# Patient Record
Sex: Female | Born: 1975 | Hispanic: Yes | Marital: Married | Smoking: Current every day smoker
Health system: Southern US, Community
[De-identification: ages and names within clinical notes are randomized; demographics above are authoritative.]

## PROBLEM LIST (undated history)

## (undated) DIAGNOSIS — C801 Malignant (primary) neoplasm, unspecified: Secondary | ICD-10-CM

## (undated) DIAGNOSIS — E119 Type 2 diabetes mellitus without complications: Secondary | ICD-10-CM

## (undated) HISTORY — PX: CHOLECYSTECTOMY: SHX55

## (undated) HISTORY — PX: ABDOMINAL SURGERY: SHX537

## (undated) HISTORY — PX: TUBAL LIGATION: SHX77

---

## 2019-07-26 ENCOUNTER — Encounter (HOSPITAL_COMMUNITY): Payer: Self-pay | Admitting: Emergency Medicine

## 2019-07-26 ENCOUNTER — Emergency Department (HOSPITAL_COMMUNITY)
Admission: EM | Admit: 2019-07-26 | Discharge: 2019-07-26 | Disposition: A | Discharge status: Home / Self Care | Payer: Medicaid - Out of State | Attending: Emergency Medicine | Admitting: Emergency Medicine

## 2019-07-26 ENCOUNTER — Other Ambulatory Visit: Payer: Self-pay

## 2019-07-26 ENCOUNTER — Emergency Department (HOSPITAL_COMMUNITY): Payer: Medicaid - Out of State

## 2019-07-26 DIAGNOSIS — E119 Type 2 diabetes mellitus without complications: Secondary | ICD-10-CM | POA: Insufficient documentation

## 2019-07-26 DIAGNOSIS — M545 Low back pain: Secondary | ICD-10-CM | POA: Diagnosis present

## 2019-07-26 DIAGNOSIS — W19XXXA Unspecified fall, initial encounter: Secondary | ICD-10-CM

## 2019-07-26 DIAGNOSIS — F17210 Nicotine dependence, cigarettes, uncomplicated: Secondary | ICD-10-CM | POA: Insufficient documentation

## 2019-07-26 DIAGNOSIS — W010XXA Fall on same level from slipping, tripping and stumbling without subsequent striking against object, initial encounter: Secondary | ICD-10-CM | POA: Insufficient documentation

## 2019-07-26 DIAGNOSIS — Z8543 Personal history of malignant neoplasm of ovary: Secondary | ICD-10-CM | POA: Diagnosis not present

## 2019-07-26 DIAGNOSIS — M5441 Lumbago with sciatica, right side: Secondary | ICD-10-CM

## 2019-07-26 HISTORY — DX: Type 2 diabetes mellitus without complications: E11.9

## 2019-07-26 HISTORY — DX: Malignant (primary) neoplasm, unspecified: C80.1

## 2019-07-26 LAB — PREGNANCY, URINE: Preg Test, Ur: NEGATIVE

## 2019-07-26 MED ORDER — TRAMADOL HCL 50 MG PO TABS
50.0000 mg | ORAL_TABLET | Freq: Once | ORAL | Status: AC
  Administered 2019-07-26: 50 mg via ORAL
  Filled 2019-07-26: qty 1

## 2019-07-26 MED ORDER — KETOROLAC TROMETHAMINE 60 MG/2ML IM SOLN
60.0000 mg | Freq: Once | INTRAMUSCULAR | Status: AC
  Administered 2019-07-26: 60 mg via INTRAMUSCULAR
  Filled 2019-07-26: qty 2

## 2019-07-26 MED ORDER — CYCLOBENZAPRINE HCL 10 MG PO TABS
10.0000 mg | ORAL_TABLET | Freq: Once | ORAL | Status: AC
  Administered 2019-07-26: 10 mg via ORAL
  Filled 2019-07-26: qty 1

## 2019-07-26 MED ORDER — CYCLOBENZAPRINE HCL 10 MG PO TABS: 10.0000 mg | ORAL_TABLET | Freq: Two times a day (BID) | ORAL | 0 refills | Status: AC | PRN

## 2019-07-26 NOTE — ED Provider Notes (Signed)
[facility] Provider Note   CSN: [id number] Arrival date & time: 07/26/19  1303     History   Chief Complaint Chief Complaint  Patient presents with  . Fall    HPI Sabrina Armstrong is a 43 y.o. female.     Patient is a 43 year old female with past medical history of ovarian cancer, diabetes, chronic back pain who presents emergency department for back pain.  Patient reports that she oftentimes has her right leg give out secondary to her spinal stenosis in her back.  Reports that today she was trying to walk very quickly to get away from some dogs that were outside when her right leg gave out.  She reports that she fell directly onto both of her knees.  She has been able to ambulate since then but reports increased pain in her back radiating into her right leg.  She denies any numbness, tingling, weakness in the leg, saddle anesthesia, fever.  Denies any abrasions or open skin wounds.  Did not hit her head or pass out.  Reports that she feels like she worsened her back condition.  She reports that she is from [place] and left her medication for her pain at home.     Past Medical History:  Diagnosis Date  . Cancer ([place])    ovarian cancer  . Diabetes mellitus without complication ([place])     There are no active problems to display for this patient.      OB History   No obstetric history on file.      Home Medications    Prior to Admission medications   Not on File    Family History No family history on file.  Social History Social History   Tobacco Use  . Smoking status: Current Every Day Smoker    Packs/day: 0.50    Types: Cigarettes  . Smokeless tobacco: Never Used  Substance Use Topics  . Alcohol use: Not Currently  . Drug use: Not Currently     Allergies   Hydrocodone and Aspirin   Review of Systems Review of Systems  Constitutional: Negative for chills and fever.  Gastrointestinal: Negative for nausea and vomiting.   Musculoskeletal: Positive for arthralgias, back pain and gait problem. Negative for joint swelling, myalgias, neck pain and neck stiffness.  Skin: Negative for rash and wound.  Neurological: Negative for dizziness, syncope, light-headedness and numbness.  All other systems reviewed and are negative.    Physical Exam Updated Vital Signs BP 98/77 (BP Location: Right Arm)   Pulse 70   Temp 98.2 F (36.8 C)   Resp 18   Ht 5\' 5"  (1.651 m)   Wt 68 kg   LMP 06/25/2019 (Approximate)   SpO2 99%   BMI 24.96 kg/m   Physical Exam Vitals signs and nursing note reviewed. Exam conducted with a chaperone present.  Constitutional:      Appearance: Normal appearance.  HENT:     Head: Normocephalic.  Eyes:     Conjunctiva/sclera: Conjunctivae normal.  Pulmonary:     Effort: Pulmonary effort is normal.  Musculoskeletal:        General: Tenderness present. No swelling, deformity or signs of injury.     Lumbar back: She exhibits tenderness and pain. She exhibits normal range of motion, no bony tenderness, no swelling, no edema, no laceration, no spasm and normal pulse.       Back:     Right lower leg: No edema.     Left lower  leg: No edema.  Skin:    General: Skin is warm and dry.  Neurological:     General: No focal deficit present.     Mental Status: She is alert.     Sensory: No sensory deficit.     Motor: No weakness.     Coordination: Coordination normal.     Gait: Gait normal.     Deep Tendon Reflexes: Reflexes normal.  Psychiatric:        Mood and Affect: Mood normal.      ED Treatments / Results  Labs (all labs ordered are listed, but only abnormal results are displayed) Labs Reviewed - No data to display  EKG None  Radiology No results found.  Procedures Procedures (including critical care time)  Medications Ordered in ED Medications  ketorolac (TORADOL) injection 60 mg (has no administration in time range)  cyclobenzaprine (FLEXERIL) tablet 10 mg (has no  administration in time range)     Initial Impression / Assessment and Plan / ED Course  I have reviewed the triage vital signs and the nursing notes.  Pertinent labs & imaging results that were available during my care of the patient were reviewed by me and considered in my medical decision making (see chart for details).  Clinical Course as of Jul 25 2112  Sat Jul 26, 2019  1627 Patient here with acute on chronic back pain after a fall outside today. She has hx of back pain. I reviewed her PMP for [place] where she is from and she has had multiple prescriptions from multiple providers in the last month. She states anaphylaxis from hydrocodone. It appears she most recently filled ultram. I am going to give her one dose of ultram. She also got flexeril and tramadol. Xray negative. D/c after meds   [KM]    Clinical Course User Index [KM] Alveria Apley, PA-C         Final Clinical Impressions(s) / ED Diagnoses   Final diagnoses:  None    ED Discharge Orders    None       Kristine Royal 07/26/19 2114    Francine Graven, DO 07/29/19 1103

## 2019-07-26 NOTE — Discharge Instructions (Signed)
Thank you for allowing me to care for you today. Please return to the emergency department if you have new or worsening symptoms. Take your medications as instructed.  ° °

## 2019-07-26 NOTE — ED Provider Notes (Signed)
[facility] Provider Note   CSN: [id number] Arrival date & time: 07/26/19  1303     History   Chief Complaint Chief Complaint  Patient presents with  . Fall    HPI Sabrina Armstrong is a 43 y.o. female.     HPI  Past Medical History:  Diagnosis Date  . Cancer ([place])    ovarian cancer  . Diabetes mellitus without complication ([place])     There are no active problems to display for this patient.      OB History   No obstetric history on file.      Home Medications    Prior to Admission medications   Medication Sig Start Date End Date Taking? Authorizing Provider  acetaminophen (TYLENOL 8 HOUR ARTHRITIS PAIN) 650 MG CR tablet Take 1,300 mg by mouth every 8 (eight) hours as needed for pain.   Yes [provider]  acetaminophen (TYLENOL) 500 MG tablet Take 1,000 mg by mouth every 6 (six) hours as needed for mild pain, moderate pain or headache.   Yes [provider]  Buprenorphine HCl 900 MCG FILM Place 900 mcg inside cheek 2 (two) times daily.   Yes [provider]  cyclobenzaprine (FLEXERIL) 10 MG tablet Take 1 tablet (10 mg total) by mouth 2 (two) times daily as needed for up to 7 days for muscle spasms. 07/26/19 08/02/19  Alveria Apley, PA-C    Family History No family history on file.  Social History Social History   Tobacco Use  . Smoking status: Current Every Day Smoker    Packs/day: 0.50    Types: Cigarettes  . Smokeless tobacco: Never Used  Substance Use Topics  . Alcohol use: Not Currently  . Drug use: Not Currently     Allergies   Hydrocodone and Aspirin   Review of Systems Review of Systems   Physical Exam Updated Vital Signs BP 98/77 (BP Location: Right Arm)   Pulse 70   Temp 98.2 F (36.8 C)   Resp 18   Ht 5\' 5"  (1.651 m)   Wt 68 kg   LMP 06/25/2019 (Approximate)   SpO2 99%   BMI 24.96 kg/m   Physical Exam   ED Treatments / Results  Labs (all labs ordered are listed, but  only abnormal results are displayed) Labs Reviewed  PREGNANCY, URINE    EKG None  Radiology Dg Lumbar Spine 2-3 Views  Result Date: 07/26/2019 CLINICAL DATA:  Fall EXAM: LUMBAR SPINE - 2-3 VIEW COMPARISON:  None. FINDINGS: Clips in the upper abdomen. Five non rib-bearing lumbar type vertebra. Lumbar alignment is within normal limits. The vertebral body heights are maintained. Disc spaces are within normal limits. IMPRESSION: Negative. Electronically Signed   By: Donavan Foil M.D.   On: 07/26/2019 16:03    Procedures Procedures (including critical care time)  Medications Ordered in ED Medications  ketorolac (TORADOL) injection 60 mg (60 mg Intramuscular Given 07/26/19 1457)  cyclobenzaprine (FLEXERIL) tablet 10 mg (10 mg Oral Given 07/26/19 1458)  traMADol (ULTRAM) tablet 50 mg (50 mg Oral Given 07/26/19 1639)     Initial Impression / Assessment and Plan / ED Course  I have reviewed the triage vital signs and the nursing notes.  Pertinent labs & imaging results that were available during my care of the patient were reviewed by me and considered in my medical decision making (see chart for details).  Clinical Course as of Jul 26 1651  Sat Jul 26, 2019  1627 Patient  here with acute on chronic back pain after a fall outside today. She has hx of back pain. I reviewed her PMP for [place] where she is from and she has had multiple prescriptions from multiple providers in the last month. She states anaphylaxis from hydrocodone. It appears she most recently filled ultram. I am going to give her one dose of ultram. She also got flexeril and tramadol. Xray negative. D/c after meds   [KM]    Clinical Course User Index [KM] Alveria Apley, PA-C       Based on review of vitals, medical screening exam, lab work and/or imaging, there does not appear to be an acute, emergent etiology for the patient's symptoms. Counseled pt on good return precautions and encouraged both PCP and ED follow-up  as needed.  Prior to discharge, I also discussed incidental imaging findings with patient in detail and advised appropriate, recommended follow-up in detail.  Clinical Impression: 1. Fall, initial encounter   2. Acute right-sided low back pain with right-sided sciatica     Disposition: Discharge  Prior to providing a prescription for a controlled substance, I independently reviewed the patient's recent prescription history on the [facility]. The patient had no recent or regular prescriptions and was deemed appropriate for a brief, less than 3 day prescription of narcotic for acute analgesia.  This note was prepared with assistance of Systems analyst. Occasional wrong-word or sound-a-like substitutions may have occurred due to the inherent limitations of voice recognition software.   Final Clinical Impressions(s) / ED Diagnoses   Final diagnoses:  Fall, initial encounter  Acute right-sided low back pain with right-sided sciatica    ED Discharge Orders         Ordered    cyclobenzaprine (FLEXERIL) 10 MG tablet  2 times daily PRN     07/26/19 1651           Kristine Royal 07/26/19 1652    Fredia Sorrow, MD 08/09/19 [phone]

## 2019-07-26 NOTE — ED Notes (Signed)
Patient transported to X-ray 
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
## 2021-03-17 IMAGING — DX DG LUMBAR SPINE 2-3V
3 series · 3 of 3 positions shown · non-contrast
Comparison: None.

CLINICAL DATA: Fall

EXAM:
LUMBAR SPINE - 2-3 VIEW

[l-spine ap]
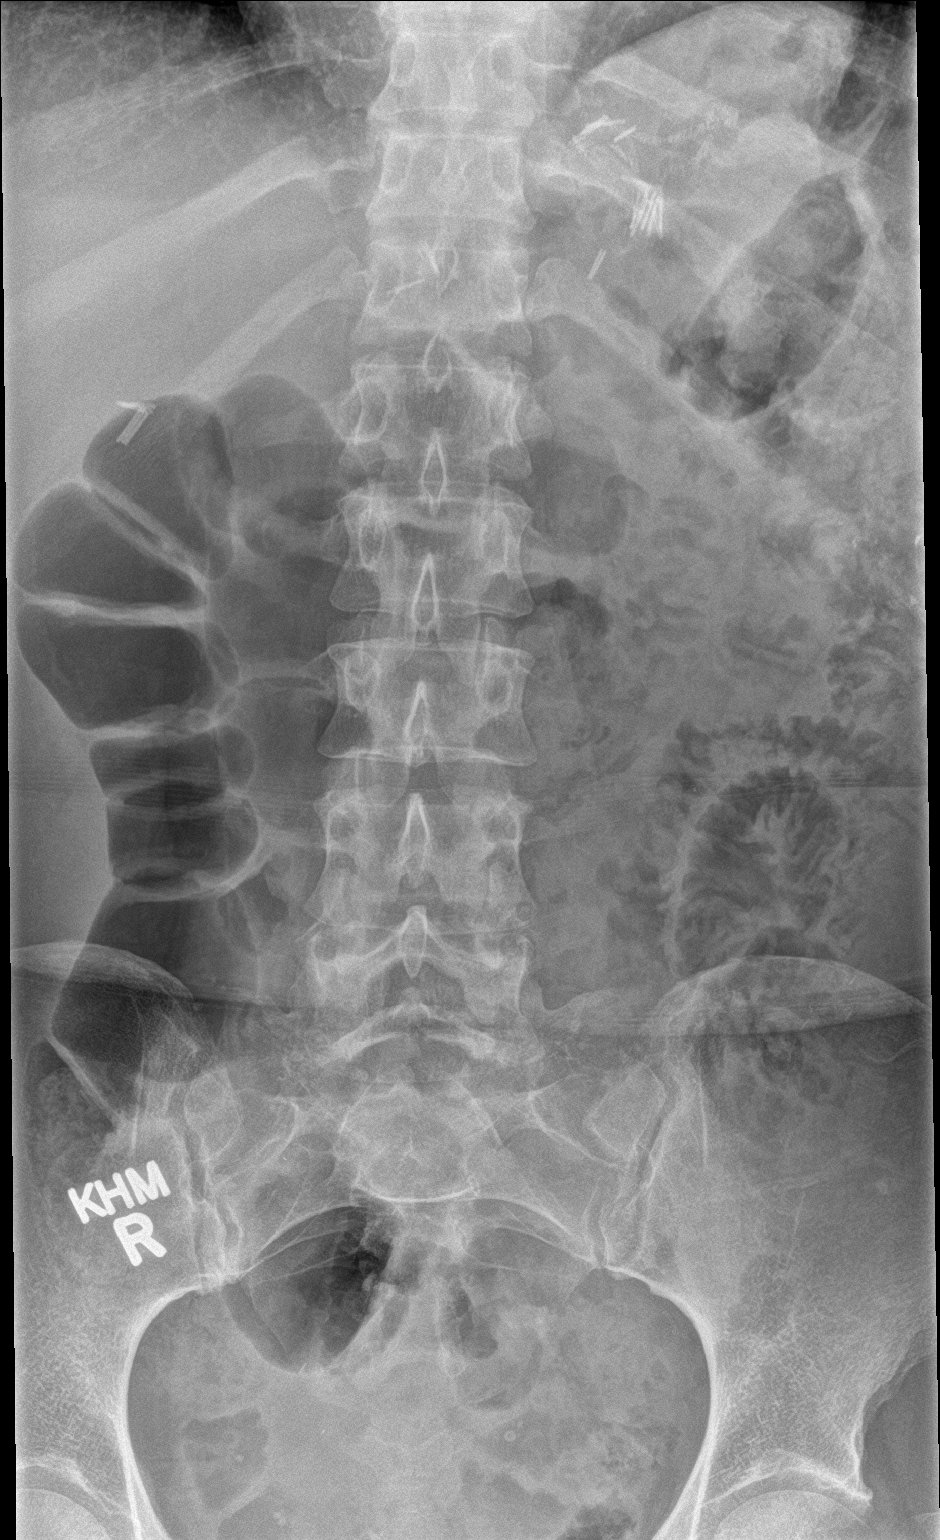

[l-spine lat]
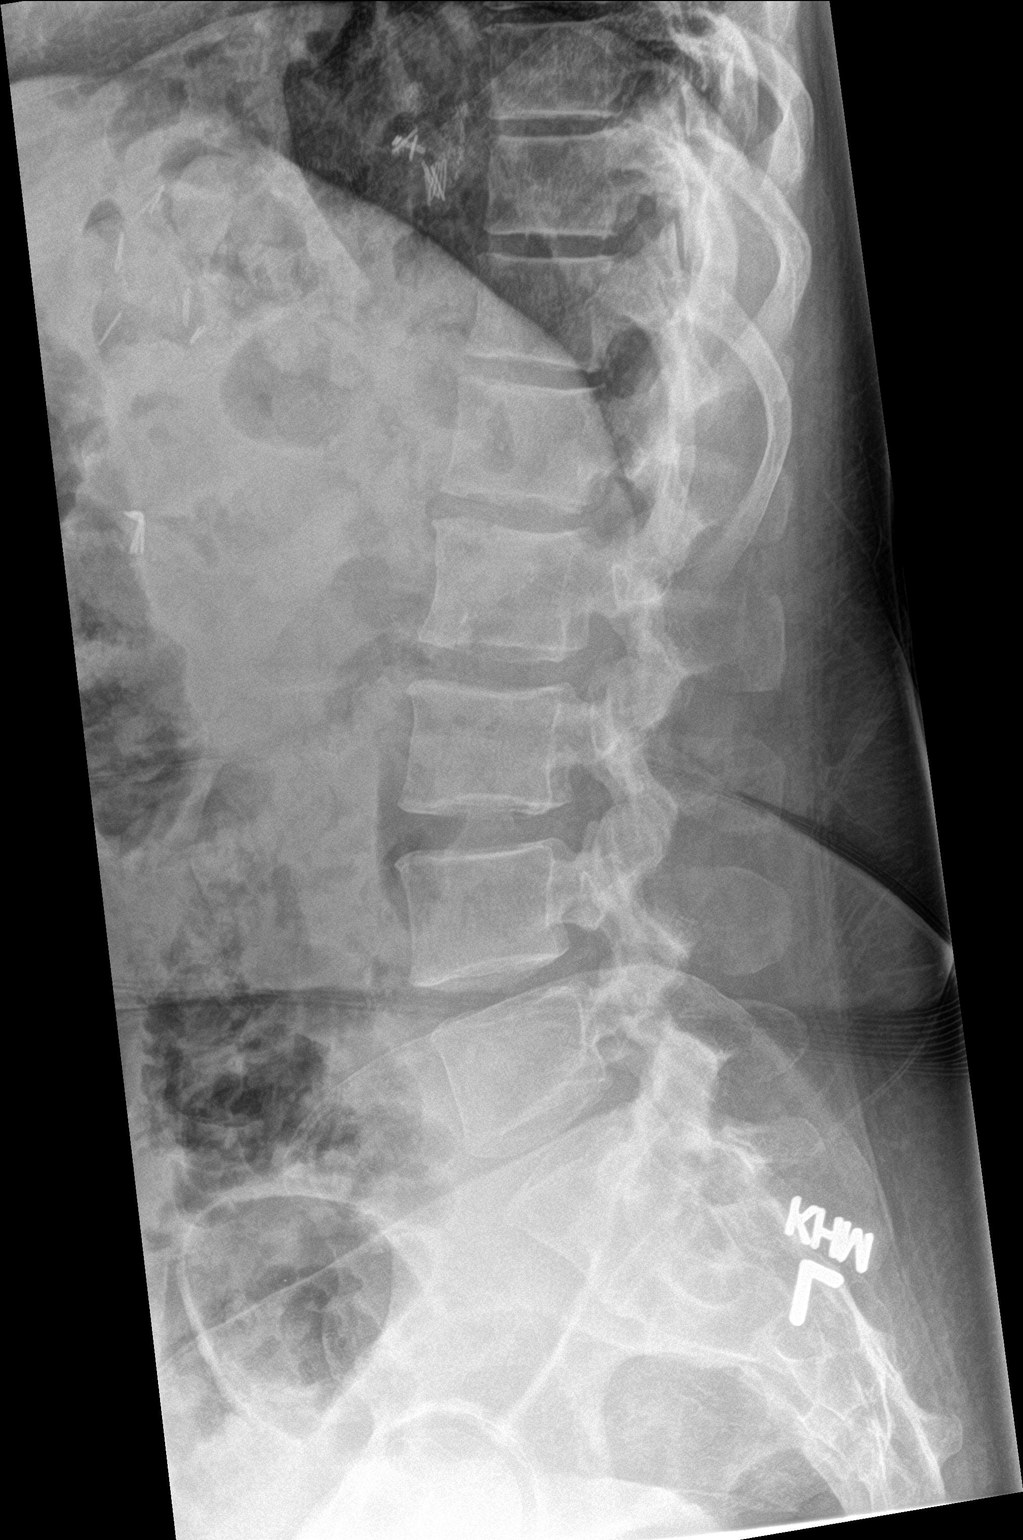

[l-spine spot]
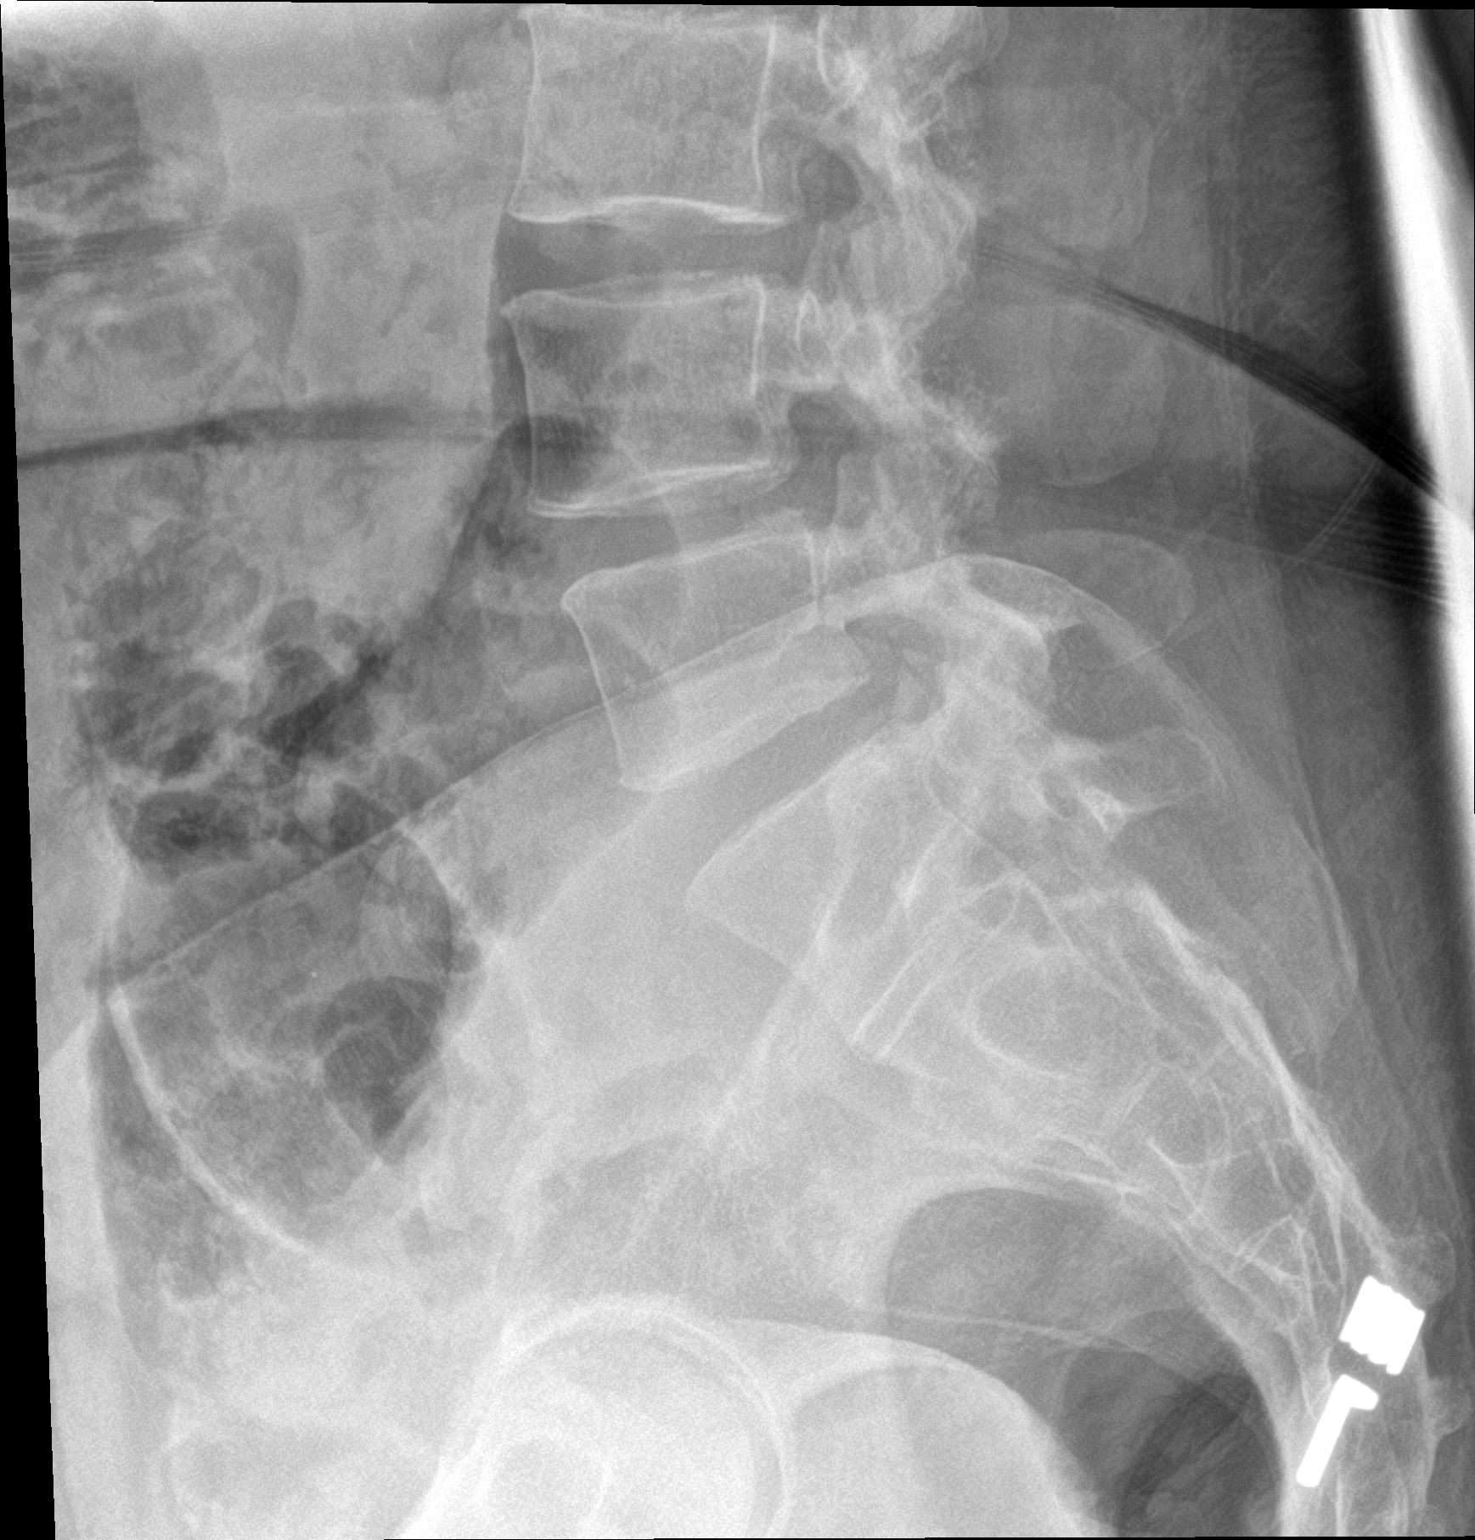

[3 of 3 positions shown; findings below may reference images not displayed]

FINDINGS: Clips in the upper abdomen. Five non rib-bearing lumbar type
vertebra. Lumbar alignment is within normal limits. The vertebral
body heights are maintained. Disc spaces are within normal limits.
IMPRESSION: Negative.
